# Patient Record
Sex: Male | Born: 2000 | Race: Black or African American | Hispanic: No | Marital: Single | Smoking: Never smoker
Health system: Southern US, Community
[De-identification: ages and names within clinical notes are randomized; demographics above are authoritative.]

## PROBLEM LIST (undated history)

## (undated) DIAGNOSIS — E785 Hyperlipidemia, unspecified: Secondary | ICD-10-CM

## (undated) HISTORY — DX: Hyperlipidemia, unspecified: E78.5

---
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
## 2019-04-30 ENCOUNTER — Other Ambulatory Visit: Payer: Self-pay

## 2019-04-30 ENCOUNTER — Encounter (HOSPITAL_COMMUNITY): Payer: Self-pay | Admitting: Emergency Medicine

## 2019-04-30 ENCOUNTER — Emergency Department (HOSPITAL_COMMUNITY): Payer: BLUE CROSS/BLUE SHIELD

## 2019-04-30 ENCOUNTER — Emergency Department (HOSPITAL_COMMUNITY)
Admission: EM | Admit: 2019-04-30 | Discharge: 2019-04-30 | Disposition: A | Discharge status: Home / Self Care | Payer: BLUE CROSS/BLUE SHIELD | Attending: Emergency Medicine | Admitting: Emergency Medicine

## 2019-04-30 DIAGNOSIS — Y999 Unspecified external cause status: Secondary | ICD-10-CM | POA: Diagnosis not present

## 2019-04-30 DIAGNOSIS — Z79899 Other long term (current) drug therapy: Secondary | ICD-10-CM | POA: Diagnosis not present

## 2019-04-30 DIAGNOSIS — Y92410 Unspecified street and highway as the place of occurrence of the external cause: Secondary | ICD-10-CM | POA: Diagnosis not present

## 2019-04-30 DIAGNOSIS — R0781 Pleurodynia: Secondary | ICD-10-CM | POA: Diagnosis present

## 2019-04-30 DIAGNOSIS — Y9389 Activity, other specified: Secondary | ICD-10-CM | POA: Diagnosis not present

## 2019-04-30 MED ORDER — NAPROXEN 500 MG PO TABS
500.0000 mg | ORAL_TABLET | Freq: Once | ORAL | Status: AC
  Administered 2019-04-30: 21:00:00 500 mg via ORAL
  Filled 2019-04-30: qty 1
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
## 2019-04-30 NOTE — Discharge Instructions (Addendum)
Your x-ray today was normal.  You may alternate ibuprofen or Tylenol to help with your pain.  You may also apply ice or heat to the area to help with swelling and inflammation.

## 2019-12-15 ENCOUNTER — Ambulatory Visit: Payer: BLUE CROSS/BLUE SHIELD | Attending: Internal Medicine

## 2019-12-15 DIAGNOSIS — Z23 Encounter for immunization: Secondary | ICD-10-CM

## 2019-12-15 NOTE — Progress Notes (Signed)
° °  Covid-19 Vaccination Clinic  Name:  Spencer Benton    MRN: [id number] DOB: 2001-05-05  12/15/2019  [name] was observed post Covid-19 immunization for 15 minutes without incident. He was provided with Vaccine Information Sheet and instruction to access the V-Safe system.   [name] was instructed to call 911 with any severe reactions post vaccine:  Difficulty breathing   Swelling of face and throat   A fast heartbeat   A bad rash all over body   Dizziness and weakness   Immunizations Administered    Name Date Dose VIS Date Route   Pfizer COVID-19 Vaccine 12/15/2019 11:47 AM 0.3 mL 09/05/2018 Intramuscular   Manufacturer: [employer]   Lot: [id number]   NDC: [address]-0

## 2020-01-07 ENCOUNTER — Ambulatory Visit: Payer: Medicaid Other | Attending: Internal Medicine

## 2020-01-07 DIAGNOSIS — Z23 Encounter for immunization: Secondary | ICD-10-CM

## 2020-01-07 NOTE — Progress Notes (Signed)
   Covid-19 Vaccination Clinic  Name:  Rayford Williamsen    MRN: [id number] DOB: 2000-08-29  01/07/2020  Mr. Grajales was observed post Covid-19 immunization for    Covid-19 Vaccination Clinic  Name:  Cree Kunert    MRN: [id number] DOB: 2001/06/01  01/07/2020  Mr. Muscatello was observed post Covid-19 immunization for 15 minutes without incident. He was provided with Vaccine Information Sheet and instruction to access the V-Safe system.   Mr. Gilardi was instructed to call 911 with any severe reactions post vaccine: Marland Kitchen Difficulty breathing  . Swelling of face and throat  . A fast heartbeat  . A bad rash all over body  . Dizziness and weakness   Immunizations Administered    Name Date Dose VIS Date Route   Pfizer COVID-19 Vaccine 01/07/2020  4:58 PM 0.3 mL 09/05/2018 Intramuscular   Manufacturer: [employer], [employer]   Lot: [id number]   NDC: [address]-3     without incident. He was provided with Vaccine Information Sheet and instruction to access the V-Safe system.   Mr. Molina was instructed to call 911 with any severe reactions post vaccine: Marland Kitchen Difficulty breathing  . Swelling of face and throat  . A fast heartbeat  . A bad rash all over body  . Dizziness and weakness   Immunizations Administered    Name Date Dose VIS Date Route   Pfizer COVID-19 Vaccine 01/07/2020  4:58 PM 0.3 mL 09/05/2018 Intramuscular   Manufacturer: [employer], [employer]   Lot: [id number]   NDC: [address]-6

## 2020-06-24 ENCOUNTER — Ambulatory Visit: Payer: Medicaid Other | Admitting: Pediatrics

## 2020-06-24 NOTE — Progress Notes (Deleted)
PCP: Gwenith Daily, MD (Inactive)   CC:  CC   History was provided by the {[email or web address]}.   Subjective:  HPI:  Joban Colledge is a 19 y.o. male Here for concern of lesion on face   Last well visit in clinic was 2019 (2.5 years ago) with multiple cancellations and no shows since that time   REVIEW OF SYSTEMS: 10 systems reviewed and negative except as per HPI  Meds: Current Outpatient Medications  Medication Sig Dispense Refill  . Multiple Vitamins-Minerals (MULTIVITAMIN ADULTS PO) Take by mouth.     No current facility-administered medications for this visit.    ALLERGIES: No Known Allergies  PMH:  Past Medical History:  Diagnosis Date  . Hyperlipidemia     Problem List:  Patient Active Problem List   Diagnosis Date Noted  . Hypertriglyceridemia, essential 11/21/2017  . Snoring 11/21/2017  . Low vitamin D level 09/06/2016  . Pre-diabetes 09/06/2016  . Knuckle pads 09/06/2016  . Acne vulgaris 09/06/2016  . Child in foster care 11/17/2015  . Obesity 05/09/2015   PSH: No past surgical history on file.  Social history:  Social History   Social History Narrative  . Not on file    Family history: Family History  Problem Relation Age of Onset  . Sleep apnea Mother   . Diabetes Maternal Aunt   . Hypertension Maternal Aunt   . Hypertension Maternal Grandmother   . Diabetes Maternal Grandmother      Objective:   Physical Examination:  Temp:   Pulse:   BP:   (Growth percentile SmartLinks can only be used for patients less than 20 years old.)  Wt:    Ht:    BMI: There is no height or weight on file to calculate BMI. (>99 %ile (Z= 2.38) based on CDC (Boys, 2-20 Years) BMI-for-age based on BMI available as of 11/18/2017 from contact on 11/18/2017.) GENERAL: Well appearing, no distress HEENT: NCAT, clear sclerae, TMs normal bilaterally, no nasal discharge, no tonsillary erythema or exudate, MMM NECK: Supple, no cervical LAD LUNGS: normal WOB,  CTAB, no wheeze, no crackles CARDIO: RR, normal S1S2 no murmur, well perfused ABDOMEN: Normoactive bowel sounds, soft, ND/NT, no masses or organomegaly GU: Normal *** EXTREMITIES: Warm and well perfused, no deformity NEURO: Awake, alert, interactive, normal strength, tone, sensation, and gait.  SKIN: No rash, ecchymosis or petechiae     Assessment:  Gurney is a 19 y.o. old male here for ***   Plan:   1. ***   Immunizations today: ***  Follow up: No follow-ups on file.   Renato Gails, MD [facility] 06/24/2020  12:46 PM

## 2020-06-25 ENCOUNTER — Ambulatory Visit (INDEPENDENT_AMBULATORY_CARE_PROVIDER_SITE_OTHER): Payer: Medicaid Other | Admitting: Student in an Organized Health Care Education/Training Program

## 2020-06-25 ENCOUNTER — Other Ambulatory Visit: Payer: Self-pay

## 2020-06-25 VITALS — Temp 97.8°F | Ht 66.06 in | Wt 191.4 lb

## 2020-06-25 DIAGNOSIS — L089 Local infection of the skin and subcutaneous tissue, unspecified: Secondary | ICD-10-CM | POA: Diagnosis not present

## 2020-06-25 DIAGNOSIS — L989 Disorder of the skin and subcutaneous tissue, unspecified: Secondary | ICD-10-CM | POA: Diagnosis not present

## 2020-06-25 MED ORDER — MUPIROCIN 2 % EX OINT: 1.0000 "application " | TOPICAL_OINTMENT | Freq: Two times a day (BID) | CUTANEOUS | 0 refills | Status: AC

## 2020-06-25 NOTE — Patient Instructions (Addendum)
Apply mupirocin ointment on skin until area is completely scabbed over.    Adult Primary Care Clinics Name Criteria Services   [facility]  Address: [address] [place], [place] [address]  Phone: [phone] Hours: Monday - Friday 9 AM -6 PM  Types of insurance accepted:  Marland Kitchen Nurse, learning disability . [facility] (orange card) . Medicaid . Medicare . Uninsured  Language services:  Marland Kitchen Video and phone interpreters available   Ages 12 and older    . Adult primary care . Onsite pharmacy . Integrated behavioral health . Financial assistance counseling . Walk-in hours for established patients  Financial assistance counseling hours: Tuesdays 2:00PM - 5:00PM  Thursday 8:30AM - 4:30PM  Space is limited, 10 on Tuesday and 20 on Thursday. It's on first come first serve basis  Name Criteria Services   [facility]  Address: [address] [place], [place] [address]  Phone: [phone]  Hours: Monday - Friday 8:30 AM - 5 PM  Types of insurance accepted:  Marland Kitchen Nurse, learning disability . Medicaid . Medicare . Uninsured  Language services:  Marland Kitchen Video and phone interpreters available   All ages - newborn to adult   . Primary care for all ages (children and adults) . Integrated behavioral health . Nutritionist . Financial assistance counseling   Name Criteria Services   [facility]  Located on the ground floor of [facility]  Address: [address]  [place],  [place]  [address]  Phone: [phone]  Hours: Monday - Friday 8:15 AM - 5 PM  Types of insurance accepted:  Marland Kitchen Nurse, learning disability . Medicaid . Medicare . Uninsured  Language services:  Marland Kitchen Video and phone interpreters available   Ages 45 and older   . Adult primary care . Nutritionist . Certified Diabetes Educator  . Integrated behavioral health . Financial assistance counseling   Name  Criteria Services   [facility]  Address: [address] [place], [place] [address]  Phone: [phone]  Hours: Monday - Friday 8:30 AM - 5 PM    Types of insurance accepted:  Marland Kitchen Nurse, learning disability . Medicaid . Medicare . Uninsured  Language services:  Marland Kitchen Video and phone interpreters available   All ages - newborn to adult   . Primary care for all ages (children and adults) . Integrated behavioral health . Financial assistance counseling

## 2020-06-25 NOTE — Progress Notes (Signed)
History was provided by the patient and mother.  Spencer Benton is a 19 y.o. male who is here for.    HPI:  Spencer Benton noticed a small red pruritic bump on Saturday that he thought was likely a pimple. It has gotten bigger since then. He tried triamcinolone ointment on Monday after calling the doctor on demand app. He also Neosporin which didn't do anything. He came the office to determine next course of treatment. He is otherwise well and not experiencing any other symptoms. Spencer Benton does admit to picking at lesion.   The following portions of the patient's history were reviewed and updated as appropriate: allergies, current medications, past family history, past medical history, past social history, past surgical history and problem list.  Physical Exam:  Temp 97.8 F (36.6 C) (Temporal)   Ht 5' 6.06" (1.678 m)   Wt 191 lb 6.4 oz (86.8 kg)   BMI 30.83 kg/m   Growth percentile SmartLinks can only be used for patients less than 48 years old.   General:   alert and cooperative  Skin:   well circumscribed lesion on chin about 2 cm in diameter, scabbed over without any drianage or honey crusting, no vesicular lesions noted  Oral cavity:   lips, mucosa, and tongue normal; teeth and gums normal  Neuro:  normal without focal findings   Assessment/Plan:  Skin lesion   Spencer Benton is a 19 yo male presenting with skin lesion on chin since last Saturday. The lesion is scabbed over and is about 2cm in diameter without active drainage or honey crusting. It is nontender to palpation and does not appear to be infected. It appears it is actually is in the process of healing. Will prescribe mupirocin to prevent superimposed bacterial infection.   - Plan: mupirocin ointment (BACTROBAN) 2 %  Dorena Bodo, MD  06/25/20

## 2021-12-31 ENCOUNTER — Ambulatory Visit (INDEPENDENT_AMBULATORY_CARE_PROVIDER_SITE_OTHER): Payer: Medicaid Other

## 2021-12-31 ENCOUNTER — Ambulatory Visit
Admission: EM | Admit: 2021-12-31 | Discharge: 2021-12-31 | Disposition: A | Discharge status: Home / Self Care | Payer: Medicaid Other | Attending: Internal Medicine | Admitting: Internal Medicine

## 2021-12-31 DIAGNOSIS — M25572 Pain in left ankle and joints of left foot: Secondary | ICD-10-CM

## 2021-12-31 DIAGNOSIS — M79672 Pain in left foot: Secondary | ICD-10-CM | POA: Diagnosis not present

## 2021-12-31 DIAGNOSIS — R509 Fever, unspecified: Secondary | ICD-10-CM

## 2021-12-31 LAB — POCT MONO SCREEN (KUC): Mono, POC: NEGATIVE

## 2021-12-31 NOTE — Discharge Instructions (Signed)
X-rays are negative.  Suspect ankle sprain.  An Ace wrap has been applied.  Please do not sleep in this.  Also recommend rest, ice, elevation of extremity.  Please follow-up with orthopedist for further evaluation and management if pain persists or worsens.  I suspect that your fever is related to some type of viral illness.  Monotest was negative.  COVID test and flu test are pending.  We will call if they are abnormal.  Please follow-up if symptoms persist or worsen.

## 2021-12-31 NOTE — ED Triage Notes (Signed)
Pt present left ankle pain and a fever, symptoms started a few days ago. Pt states he stepped out a car and twisted his ankle.

## 2021-12-31 NOTE — ED Provider Notes (Signed)
[facility]    CSN: [id number] Arrival date & time: 12/31/21  1055      History   Chief Complaint Chief Complaint  Patient presents with   Ankle Pain   Fever    HPI Spencer Benton is a 21 y.o. male.   Patient presents with fever, fatigue, left ankle pain.  Patient reports that fever and fatigue started about 3 days ago.  Denies any associated upper respiratory symptoms, cough, sore throat, nausea, vomiting, diarrhea, abdominal pain.  Tmax at home was 99.  He took Advil for pain and fever.  Patient reports that he injured his ankle yesterday while getting out of the car.  He twisted his ankle over.  Denies any fall or hitting head or losing consciousness.  Denies any numbness or tingling.  Patient is able to bear weight.   Ankle Pain Fever   Past Medical History:  Diagnosis Date   Hyperlipidemia     Patient Active Problem List   Diagnosis Date Noted   Hypertriglyceridemia, essential 11/21/2017   Snoring 11/21/2017   Low vitamin D level 09/06/2016   Pre-diabetes 09/06/2016   Knuckle pads 09/06/2016   Acne vulgaris 09/06/2016   Child in foster care 11/17/2015   Obesity 05/09/2015    History reviewed. No pertinent surgical history.     Home Medications    Prior to Admission medications   Medication Sig Start Date End Date Taking? Authorizing Provider  Multiple Vitamins-Minerals (MULTIVITAMIN ADULTS PO) Take by mouth.    [provider]  mupirocin ointment (BACTROBAN) 2 % Apply 1 application topically 2 (two) times daily. 06/25/20   Dorena Bodo, MD    Family History Family History  Problem Relation Age of Onset   Sleep apnea Mother    Diabetes Maternal Aunt    Hypertension Maternal Aunt    Hypertension Maternal Grandmother    Diabetes Maternal Grandmother     Social History Social History   Tobacco Use   Smoking status: Never   Smokeless tobacco: Never   Tobacco comments:    no smoking   Substance Use Topics   Alcohol use:  Never    Alcohol/week: 0.0 standard drinks of alcohol   Drug use: Never     Allergies   Patient has no known allergies.   Review of Systems Review of Systems Per HPI  Physical Exam Triage Vital Signs ED Triage Vitals  Enc Vitals Group     BP 12/31/21 1108 129/76     Pulse Rate 12/31/21 1108 (!) 55     Resp 12/31/21 1108 18     Temp 12/31/21 1108 98.8 F (37.1 C)     Temp Source 12/31/21 1108 Oral     SpO2 12/31/21 1108 99 %     Weight --      Height --      Head Circumference --      Peak Flow --      Pain Score 12/31/21 1110 5     Pain Loc --      Pain Edu? --      Excl. in GC? --    No data found.  Updated Vital Signs BP 129/76 (BP Location: Left Arm)   Pulse (!) 55   Temp 98.8 F (37.1 C) (Oral)   Resp 18   SpO2 99%   Visual Acuity Right Eye Distance:   Left Eye Distance:   Bilateral Distance:    Right Eye Near:   Left Eye Near:  Bilateral Near:     Physical Exam Constitutional:      General: He is not in acute distress.    Appearance: Normal appearance. He is not toxic-appearing or diaphoretic.  HENT:     Head: Normocephalic and atraumatic.     Right Ear: Tympanic membrane and ear canal normal.     Left Ear: Tympanic membrane and ear canal normal.     Nose: Nose normal.     Mouth/Throat:     Mouth: Mucous membranes are moist.     Pharynx: No posterior oropharyngeal erythema.  Eyes:     Extraocular Movements: Extraocular movements intact.     Conjunctiva/sclera: Conjunctivae normal.     Pupils: Pupils are equal, round, and reactive to light.  Cardiovascular:     Rate and Rhythm: Normal rate and regular rhythm.     Pulses: Normal pulses.     Heart sounds: Normal heart sounds.  Pulmonary:     Effort: Pulmonary effort is normal. No respiratory distress.     Breath sounds: Normal breath sounds. No stridor. No wheezing, rhonchi or rales.  Abdominal:     General: Abdomen is flat. Bowel sounds are normal. There is no distension.      Palpations: Abdomen is soft. There is no mass.     Tenderness: There is no abdominal tenderness. There is no right CVA tenderness, left CVA tenderness, guarding or rebound.     Hernia: No hernia is present.  Musculoskeletal:        General: Normal range of motion.     Cervical back: Normal range of motion.     Comments: Tenderness to palpation to left lateral ankle that extends slightly into dorsal surface of left foot overlying fourth and fifth metatarsals.  Mild swelling noted to this area of tenderness as well.  Patient has full range of motion of ankle, foot, toes.  Neurovascular intact.  No obvious redness or warmth noted.  Skin:    General: Skin is warm and dry.  Neurological:     General: No focal deficit present.     Mental Status: He is alert and oriented to person, place, and time. Mental status is at baseline.  Psychiatric:        Mood and Affect: Mood normal.        Behavior: Behavior normal.      UC Treatments / Results  Labs (all labs ordered are listed, but only abnormal results are displayed) Labs Reviewed  COVID-19, FLU A+B NAA  POCT MONO SCREEN ([facility])    EKG   Radiology DG Ankle Complete Left  Result Date: 12/31/2021 CLINICAL DATA:  Foot and ankle pain after twisting ankle today. EXAM: LEFT ANKLE COMPLETE - 3+ VIEW COMPARISON:  Ankle radiograph January 03, 2016 FINDINGS: There is no evidence of fracture, dislocation, or joint effusion. There is no evidence of arthropathy or other focal bone abnormality. Soft tissues are unremarkable. IMPRESSION: No acute osseous abnormality identified. Electronically Signed   By: Maudry Mayhew M.D.   On: 12/31/2021 11:49   DG Foot Complete Left  Result Date: 12/31/2021 CLINICAL DATA:  Foot and ankle pain after twisting ankle today. EXAM: LEFT FOOT - COMPLETE 3+ VIEW COMPARISON:  Left ankle radiograph January 03, 2016. FINDINGS: There is no evidence of fracture or dislocation. There is no evidence of arthropathy or other focal bone  abnormality. Soft tissues are unremarkable. IMPRESSION: No acute osseous abnormality identified. Electronically Signed   By: Maudry Mayhew M.D.   On: 12/31/2021 11:36  Procedures Procedures (including critical care time)  Medications Ordered in UC Medications - No data to display  Initial Impression / Assessment and Plan / UC Course  I have reviewed the triage vital signs and the nursing notes.  Pertinent labs & imaging results that were available during my care of the patient were reviewed by me and considered in my medical decision making (see chart for details).     X-rays are negative for any acute bony abnormality.  Suspect ankle sprain.  Ace wrap applied.  RICE.  Orthopedist contact information for patient and advised patient to follow-up if symptoms persist or worsen.  Also has fever and fatigue.  No suspicion for septic joint given appearance on physical exam and that patient has an ankle injury.  Patient also does not have fever currently. Suspect fever and fatigue is due to a viral illness.  Rapid mono was negative.  COVID and flu test pending.  Discussed supportive care for viral illness with patient.  Patient was advised that if ankle pain persists or worsens, fever worsens, or erythema develops of ankle to follow-up at ER for further evaluation and management.  Discussed return and ER precautions.  Patient verbalized understanding and was agreeable with plan. Final Clinical Impressions(s) / UC Diagnoses   Final diagnoses:  Acute left ankle pain  Left foot pain  Fever, unspecified     Discharge Instructions      X-rays are negative.  Suspect ankle sprain.  An Ace wrap has been applied.  Please do not sleep in this.  Also recommend rest, ice, elevation of extremity.  Please follow-up with orthopedist for further evaluation and management if pain persists or worsens.  I suspect that your fever is related to some type of viral illness.  Monotest was negative.  COVID test and  flu test are pending.  We will call if they are abnormal.  Please follow-up if symptoms persist or worsen.     ED Prescriptions   None    PDMP not reviewed this encounter.   Gustavus Bryant, [place] 12/31/21 1202

## 2022-01-01 LAB — COVID-19, FLU A+B NAA
Influenza A, NAA: NOT DETECTED
Influenza B, NAA: NOT DETECTED
SARS-CoV-2, NAA: NOT DETECTED
